# Patient Record
Sex: Female | Born: 1980 | Race: White | Hispanic: No | Marital: Married | Smoking: Former smoker
Health system: Southern US, Community
[De-identification: ages and names within clinical notes are randomized; demographics above are authoritative.]

## PROBLEM LIST (undated history)

## (undated) DIAGNOSIS — E119 Type 2 diabetes mellitus without complications: Secondary | ICD-10-CM

## (undated) DIAGNOSIS — F419 Anxiety disorder, unspecified: Secondary | ICD-10-CM

## (undated) HISTORY — PX: OTHER SURGICAL HISTORY: SHX169

## (undated) HISTORY — PX: CHOLECYSTECTOMY: SHX55

## (undated) HISTORY — PX: WISDOM TOOTH EXTRACTION: SHX21

---

## 2013-04-01 ENCOUNTER — Encounter (HOSPITAL_COMMUNITY): Payer: Self-pay | Admitting: Specialist

## 2013-04-07 ENCOUNTER — Encounter: Payer: 59 | Attending: Specialist | Admitting: *Deleted

## 2013-04-07 ENCOUNTER — Ambulatory Visit (HOSPITAL_COMMUNITY)
Admission: RE | Admit: 2013-04-07 | Discharge: 2013-04-07 | Disposition: A | Discharge status: Home / Self Care | Payer: 59 | Source: Ambulatory Visit | Attending: Specialist | Admitting: Specialist

## 2013-04-07 DIAGNOSIS — E119 Type 2 diabetes mellitus without complications: Secondary | ICD-10-CM

## 2013-04-07 DIAGNOSIS — Z713 Dietary counseling and surveillance: Secondary | ICD-10-CM | POA: Insufficient documentation

## 2013-04-07 DIAGNOSIS — O99810 Abnormal glucose complicating pregnancy: Secondary | ICD-10-CM | POA: Insufficient documentation

## 2013-04-07 NOTE — Consult Note (Signed)
Yvonne Mann come to clinic for GDM education. She was diagnosed with T2DM approximately 2 yrs ago. She was placed on Metformin without dietary education. She is now [redacted] wks pregnant. Dr. Wiliam Ke in [place] is her OB/GYN who sent her to MFM for consult and education. Her Metformin had been increased and started on Novolog 8u with each meal. She has been educations on the A&P of T2DM diabetes. Dietary modifications. 1-2 (+-)carb choices for breakfast, 2-3(+-) for lunch and 2-3(+-) for dinner. She is to have protein with each meal. She presently has a glucometer and is testing 4Xdaily and logging dietary intake. Most of her readings are outside of the reading goals of FBS 60-90 and 2hpp <120. She was accompanied by her mother. Her father is also T2DM. She will continue to follow with Dr. Wiliam Ke.

## 2013-04-07 NOTE — Consult Note (Signed)
MFM consult  32 yr old G1P0 at 8 weeks with type II diabetes referred by Dr. Wiliam Ke for consult.  Past OB hx: none Past gyn hx: infertility- this pregnancy conceived with clomid PMH: diabetes diagnosed 2 years ago was managed with metformin PSH: none Medications: Metformin 1000 mg qam, 1000mg  pre dinner; 500mg  qhs; humolog 8 units with meals; PNV Allergies: codeine Social history: stopped tobacco with pregnancy, no alcohol or illicit drugs in pregnancy  I counseled the patient as follows:  1. Type II diabetes:  Discussed increased risks in pregnancy include: fetal macrosomia, shoulder dystocia, and increased risk of requiring a Cesarean delivery. There is also an increased risk of developing preeclampsia during the pregnancy. There is an increased risk of congenital malformations related to level of diabetic control in the first trimester. I discussed there is an increased risk of stillbirth, neonatal hypoglycemia, neonatal jaundice, and neonatal electrolyte disturbances. I recommend strict glucose control maintaining fasting blood sugars <90 and 2 hour postprandial values <120. I reviewed the patient's blood sugars and all fasting values were elevated. Given this I recommend discontinuing metformin (as patient is on a large dose and still not controlled at 8 weeks) and starting a long acting insulin either NPH in the am and qhs or lantus qhs and adjust as needed to attain sugar goals. Patient has some elevated post meals but most are well controlled. Recommend continue humolog and adjust as needed to attain sugar control. I recommend starting fetal kick counts- at [redacted] weeks gestation. I recommend starting antenatal testing with either weekly biophysical profiles or twice weekly nonstress tests and weekly amniotic fluid index starting at 32 weeks.  I recommend following fetal growth every 4 weeks. I recommend delivery by estimated due date but not prior to 39 weeks in the absence of other  complications. I recommend obtain baseline TSH, CBC, AST, ALT, BUN, creatinine, uric acid, 24 hour urine total protein, and EKG. Recommend check hemoglobin A1C every trimester.  Given the increased risk of congenital anomalies recommend fetal anatomic survey at 18-20 weeks and fetal echocardiogram at 18-[redacted] weeks gestation.  2. Discussed option of first trimester screen and its limitations in detecting fetal aneuploidy. Patient is interested in this screening and will schedule. - if done recommend maternal serum AFP at 15-[redacted] weeks gestation  I spent 30 minutes in face to face consultation with the patient.  Please call with questions.  Eulis Foster, MD

## 2013-04-14 NOTE — Consult Note (Signed)
GDM EDUCATION Patient present for GDM education. Recent readings reviewed, instructed in carb counting with meal plan provided.

## 2013-05-03 ENCOUNTER — Other Ambulatory Visit: Payer: Self-pay

## 2013-06-09 ENCOUNTER — Other Ambulatory Visit (HOSPITAL_COMMUNITY): Payer: Self-pay | Admitting: Specialist

## 2013-06-09 DIAGNOSIS — IMO0002 Reserved for concepts with insufficient information to code with codable children: Secondary | ICD-10-CM

## 2013-06-09 DIAGNOSIS — O169 Unspecified maternal hypertension, unspecified trimester: Secondary | ICD-10-CM

## 2013-06-10 ENCOUNTER — Encounter (HOSPITAL_COMMUNITY): Payer: Self-pay

## 2013-06-10 ENCOUNTER — Ambulatory Visit (HOSPITAL_COMMUNITY)
Admission: RE | Admit: 2013-06-10 | Discharge: 2013-06-10 | Disposition: A | Discharge status: Home / Self Care | Payer: 59 | Source: Ambulatory Visit | Attending: Specialist | Admitting: Specialist

## 2013-06-10 DIAGNOSIS — IMO0002 Reserved for concepts with insufficient information to code with codable children: Secondary | ICD-10-CM

## 2013-06-10 DIAGNOSIS — O10019 Pre-existing essential hypertension complicating pregnancy, unspecified trimester: Secondary | ICD-10-CM | POA: Insufficient documentation

## 2013-06-10 DIAGNOSIS — E669 Obesity, unspecified: Secondary | ICD-10-CM | POA: Insufficient documentation

## 2013-06-10 DIAGNOSIS — Z1389 Encounter for screening for other disorder: Secondary | ICD-10-CM | POA: Insufficient documentation

## 2013-06-10 DIAGNOSIS — O358XX0 Maternal care for other (suspected) fetal abnormality and damage, not applicable or unspecified: Secondary | ICD-10-CM | POA: Insufficient documentation

## 2013-06-10 DIAGNOSIS — O24919 Unspecified diabetes mellitus in pregnancy, unspecified trimester: Secondary | ICD-10-CM | POA: Insufficient documentation

## 2013-06-10 DIAGNOSIS — O169 Unspecified maternal hypertension, unspecified trimester: Secondary | ICD-10-CM

## 2013-06-10 DIAGNOSIS — Z363 Encounter for antenatal screening for malformations: Secondary | ICD-10-CM | POA: Insufficient documentation

## 2013-06-30 ENCOUNTER — Other Ambulatory Visit (HOSPITAL_COMMUNITY): Payer: Self-pay | Admitting: Specialist

## 2013-06-30 DIAGNOSIS — IMO0002 Reserved for concepts with insufficient information to code with codable children: Secondary | ICD-10-CM

## 2013-07-05 ENCOUNTER — Ambulatory Visit (HOSPITAL_COMMUNITY)
Admission: RE | Admit: 2013-07-05 | Discharge: 2013-07-05 | Disposition: A | Discharge status: Home / Self Care | Payer: 59 | Source: Ambulatory Visit | Attending: Specialist | Admitting: Specialist

## 2013-07-05 VITALS — BP 141/83 | HR 100 | Wt 306.0 lb

## 2013-07-05 DIAGNOSIS — IMO0002 Reserved for concepts with insufficient information to code with codable children: Secondary | ICD-10-CM

## 2013-07-05 DIAGNOSIS — O10019 Pre-existing essential hypertension complicating pregnancy, unspecified trimester: Secondary | ICD-10-CM | POA: Insufficient documentation

## 2013-07-05 DIAGNOSIS — O24919 Unspecified diabetes mellitus in pregnancy, unspecified trimester: Secondary | ICD-10-CM | POA: Insufficient documentation

## 2013-07-05 DIAGNOSIS — E669 Obesity, unspecified: Secondary | ICD-10-CM | POA: Insufficient documentation

## 2013-07-05 NOTE — Progress Notes (Signed)
Yvonne Mann  was seen today for an ultrasound appointment.  See full report in [facility].  Impression: Single IUP at 21 3/7 weeks Type 2 diabetes, Chronic hypertension Normal interval anatomy. Somewhat limited views of the fetal heart and face (profile) obtained. Anterior placenta without previa Normal amniotic fluid volume  Fetal echo scheduled 3 December  Recommendations: Recommend follow-up ultrasound examination in 6 weeks for growth and to reevaluate fetal face / heart.  Alpha Gula, MD

## 2013-08-16 ENCOUNTER — Encounter (HOSPITAL_COMMUNITY): Payer: Self-pay

## 2013-08-16 ENCOUNTER — Ambulatory Visit (HOSPITAL_COMMUNITY)
Admission: RE | Admit: 2013-08-16 | Discharge: 2013-08-16 | Disposition: A | Discharge status: Home / Self Care | Payer: 59 | Source: Ambulatory Visit | Attending: Specialist | Admitting: Specialist

## 2013-08-16 DIAGNOSIS — O10019 Pre-existing essential hypertension complicating pregnancy, unspecified trimester: Secondary | ICD-10-CM | POA: Insufficient documentation

## 2013-08-16 DIAGNOSIS — O24919 Unspecified diabetes mellitus in pregnancy, unspecified trimester: Secondary | ICD-10-CM | POA: Insufficient documentation

## 2013-08-16 DIAGNOSIS — E669 Obesity, unspecified: Secondary | ICD-10-CM | POA: Insufficient documentation

## 2013-08-16 DIAGNOSIS — IMO0002 Reserved for concepts with insufficient information to code with codable children: Secondary | ICD-10-CM

## 2014-01-26 ENCOUNTER — Ambulatory Visit: Payer: Self-pay | Admitting: Surgery

## 2014-01-27 LAB — PATHOLOGY REPORT

## 2014-04-15 ENCOUNTER — Encounter (HOSPITAL_COMMUNITY): Payer: Self-pay | Admitting: *Deleted

## 2014-06-19 ENCOUNTER — Encounter (HOSPITAL_COMMUNITY): Payer: Self-pay | Admitting: *Deleted

## 2014-12-09 NOTE — Op Note (Signed)
PATIENT NAME:  Yvonne Mann, [name] MR#:  [id number] DATE OF BIRTH:  Sep 13, 1980  DATE OF PROCEDURE:  01/26/2014  DATE OF DICTATION: 01/26/2014   PREOPERATIVE DIAGNOSIS: Biliary colic and cholelithiasis.   POSTOPERATIVE DIAGNOSIS: Biliary colic and cholelithiasis.   PROCEDURE PERFORMED: Robotically assisted multiport laparoscopic cholecystectomy.   SURGEON: Hashir Deleeuw A. Egbert GaribaldiBird, MD  ASSISTANT: None.   ANESTHESIA: General endotracheal.   SPECIMENS: Gallbladder with contents.   ESTIMATED BLOOD LOSS: 25 mL.   DRAINS: None.   LAP AND NEEDLE COUNT: Correct x2.   DESCRIPTION OF PROCEDURE: With informed consent, supine position, general endotracheal anesthesia, the patient's abdomen was widely prepped and draped with ChloraPrep solution.   Timeout was observed.   An infraumbilical transverse oriented skin incision was fashioned with a scalpel and carried down to the fascia with sharp dissection. The fascia was incised in the midline and elevated with Kocher clamps. The peritoneum was entered sharply. A 10 mm balloon Hasson trocar was then placed, and pneumoperitoneum was established. The remaining trocars were placed in the standard multiport orientation. The patient was then positioned in reverse Trendelenburg and airplane right side up. The patient cart was docked. Robotic arms were placed. Instruments were directed to the target anatomy under direct visualization. I then moved to the console.   Traction was placed on the body of the gallbladder, elevating it towards the right shoulder and laterally. The hepatoduodenal ligament was then incised with blunt technique, liberating a cystic artery and cystic duct and small lymphatic. The cystic duct was critically identified along with the artery. The cystic duct was doubly clipped on the portal side, singly clipped on the gallbladder side with medium locking Hemoclip. Lymphatic was divided between single hemoclips. The cystic artery was divided between 2  clips on the portal side, singly clipped on the gallbladder side and sharp scissors. The gallbladder was then retrieved off the gallbladder fossa utilizing hook cautery apparatus, and it was placed into an Endo Catch device and retrieved. Pneumoperitoneum was then re-established. The patient cart was then undocked. The right upper quadrant was irrigated with approximately 500 mL of warm normal saline and aspirated dry, and hemostasis appeared to be adequate. Ports were then removed, and the infraumbilical fascial defect was closed with a vertically oriented #0 Vicryl suture in figure-of-eight orientation. A total of 30 mL of 0.25% plain Marcaine was infiltrated along all skin incisions and fascial incisions prior to closure. The 4-0 Vicryl subcuticular was applied to all skin edges, followed by the application of benzoin, Steri-Strips, Telfa and Tegaderm. The patient was then subsequently extubated and taken to the recovery room in stable and satisfactory condition by anesthesia services.    ____________________________ Redge GainerMark A. Egbert GaribaldiBird, MD mab:lb D: 01/26/2014 10:50:16 ET T: 01/26/2014 11:11:40 ET JOB#: [id number]  cc: Loraine LericheMark A. Egbert GaribaldiBird, MD, <Dictator> Raynald KempMARK A Desirre Eickhoff MD ELECTRONICALLY SIGNED 01/29/2014 11:47

## 2017-03-12 ENCOUNTER — Other Ambulatory Visit (HOSPITAL_COMMUNITY): Payer: Self-pay | Admitting: Specialist

## 2017-03-12 DIAGNOSIS — Z3689 Encounter for other specified antenatal screening: Secondary | ICD-10-CM

## 2017-04-15 ENCOUNTER — Encounter (HOSPITAL_COMMUNITY): Payer: Self-pay | Admitting: *Deleted

## 2017-04-16 ENCOUNTER — Encounter: Payer: 59 | Attending: Obstetrics and Gynecology | Admitting: *Deleted

## 2017-04-16 ENCOUNTER — Ambulatory Visit (HOSPITAL_COMMUNITY)
Admission: RE | Admit: 2017-04-16 | Discharge: 2017-04-16 | Disposition: A | Discharge status: Home / Self Care | Payer: 59 | Source: Ambulatory Visit | Attending: Specialist | Admitting: Specialist

## 2017-04-16 ENCOUNTER — Ambulatory Visit (HOSPITAL_COMMUNITY): Payer: Self-pay

## 2017-04-16 ENCOUNTER — Encounter (HOSPITAL_COMMUNITY): Payer: Self-pay

## 2017-04-16 DIAGNOSIS — O24112 Pre-existing diabetes mellitus, type 2, in pregnancy, second trimester: Secondary | ICD-10-CM | POA: Diagnosis not present

## 2017-04-16 DIAGNOSIS — O24911 Unspecified diabetes mellitus in pregnancy, first trimester: Secondary | ICD-10-CM | POA: Diagnosis not present

## 2017-04-16 DIAGNOSIS — Z3689 Encounter for other specified antenatal screening: Secondary | ICD-10-CM

## 2017-04-16 DIAGNOSIS — Z3A16 16 weeks gestation of pregnancy: Secondary | ICD-10-CM | POA: Diagnosis not present

## 2017-04-16 DIAGNOSIS — E119 Type 2 diabetes mellitus without complications: Secondary | ICD-10-CM | POA: Diagnosis not present

## 2017-04-16 DIAGNOSIS — E1165 Type 2 diabetes mellitus with hyperglycemia: Secondary | ICD-10-CM

## 2017-04-16 DIAGNOSIS — O24312 Unspecified pre-existing diabetes mellitus in pregnancy, second trimester: Secondary | ICD-10-CM | POA: Diagnosis present

## 2017-04-16 HISTORY — DX: Anxiety disorder, unspecified: F41.9

## 2017-04-16 HISTORY — DX: Type 2 diabetes mellitus without complications: E11.9

## 2017-04-16 NOTE — Progress Notes (Signed)
Maternal Fetal Medicine Consultation  Requesting Provider(s):Newell  Primary OB: Newell Reason for consultation: Type 2 diabetes  HPI:36 yo P1001 at 16+6 weeks with type 2 diabetes diagnosed c. 5 years ago. She is under the care of [place] is managing her diabetes. She is currently on Metformin 850mg  TID, Humalog-N 16 units at bedtime, and Humalog 8 to 10 units with each meal. She has had elevated fastings between 120 and 140 and most of her postprandials are elevated as well. Intrestingly, she had a HgbA1C of 6.7% in May, and last week a repeat was 5.6%. She was seen here in MFM for an initial consult and for US with her last baby in 2015. That pregnancy ended with a cesarean delivery of a healthy 7 pound 13 ounce infant. OB History: OB History    Gravida Para Term Preterm AB Living   3 1 1     1    SAB TAB Ectopic Multiple Live Births                  PMH:  Past Medical History:  Diagnosis Date  . Anxiety   . Diabetes mellitus without complication (HCC)     PSH:  Past Surgical History:  Procedure Laterality Date  . CESAREAN SECTION    . CHOLECYSTECTOMY    . Pylenoidal cyst    . WISDOM TOOTH EXTRACTION     Meds: Metformin/insulin as described above, PNV, Lexapro 10mg  qD Allergies: oxycodone/codiene FH: See EPIC section Soc: See EPIC section  Review of Systems: no vaginal bleeding or cramping/contractions, no LOF, no nausea/vomiting. All other systems reviewed and are negative.  PE:  VS: See EPIC section GEN: well-appearing female ABD: gravid, NT  Please see separate document for fetal ultrasound report.  A/P: Type 2 diabetes The patient remembers a good bit of the counseling given during her last MFM consult in 2015. I reiterated the goals for euglycemia (FBS <100 at least, preferably <95, 2 hour postprandials <120) I would recommend discontinuing her metformin as she is on a very high dose, and if her insurance will cover it, making some changes in  her insulin. I would recommend using an ultra-long insulin at bedtime. My choice would be Lantus 20 units SQ. The humalog can remain the same but would increase the dosage to 15-20 units before meals. Weekly evaluations in your office of by phone will be necessary during the first few weeks to adjust the doses to get as close as possible to euglycemia. I have asked her to begin 81mg  of aspirin daily for [facility] prevention You have already started her baseline lab collection and I agree with your plan to repeat the 24 hour urine after her UTI is treated. I will set her up for a fetal echocardiogram here, and will see her again in MFM in4 weeks. I anticipate seeing her every 4 weeks for scans. She should start antepartum testing at 30-32 weeks and continue through delivery   Thank you for the opportunity to be a part of the care of Yvonne PuntAmanda S Mann. Please contact our office if we can be of further assistance.   I spent approximately 30 minutes with this patient with over 50% of time spent in face-to-face counseling.

## 2017-04-16 NOTE — Progress Notes (Signed)
  Patient was seen on 04/16/2017 for Type 2 Diabetes with pregnancy self-management. She is here with her husband. She verbalized good understanding of Carbohydrate Counting and states she is comfortable with insuilin injections.  She is testing 3-4 times daily and recording food and BG info in small notebook which she brought to the appointment. The following learning objectives were met by the patient :   States the definition of Type 2 Diabetes with pregnancy  States why dietary management is important in controlling blood glucose  Describes the effects of carbohydrates on blood glucose levels  Demonstrates ability to create a balanced meal plan  Demonstrates carbohydrate counting   States when to check blood glucose levels  Demonstrates proper blood glucose monitoring techniques  States the effect of stress and exercise on blood glucose levels  States the importance of limiting caffeine and abstaining from alcohol and smoking  I discussed with her today the potential option of moving to an Insulin to Carb ratio with her humulog insulin at meal time if her MD agreed, so the dose could be adjusted more accurately for any variation in carb intake. I used example that if she is taking 8 units for a 32 gram carb meal that = an Insulin to Carb Ratio of 1 unit/4 grams carbohydrate. This can be better assessed after she completes Log Sheet with carb information to see what ratio will be most effective for her.  I also provided a Log Sheet for her to record her BG and carb intake at each meal so her insulin doses can be better assessed. She agreed to use the Log Sheet I provided and to bring it to every medical appointment.   Plan:  Aim for 2-3 Carb Choices per meal (30-45 grams)  Aim for 1-2 Carbs per snack Continue  reading food labels for Total Carbohydrate of foods Consider  increasing your activity level by walking or other activity daily as tolerated Continue checking BG before  breakfast and 2 hours after first bite of breakfast, lunch and dinner as directed by MD  Bring Log Book to every medical appointment   Take medication as directed by MD  Patient already has a meter and is testing pre breakfast and 2 hours each meal as directed by MD Review of Log Book shows: FBG between 120-140 and post meal BG often above 120 mg/dl.  Patient instructed to monitor glucose levels: FBS: 60 - 95 mg/dl 2 hour: <120 mg/dl  Patient received the following handouts:  Nutrition Diabetes and Pregnancy  Carbohydrate Counting List  Insulin Action handout  Patient will be seen for follow-up in 4 weeks.

## 2017-04-17 ENCOUNTER — Other Ambulatory Visit (HOSPITAL_COMMUNITY): Payer: Self-pay | Admitting: *Deleted

## 2017-04-17 DIAGNOSIS — O24119 Pre-existing diabetes mellitus, type 2, in pregnancy, unspecified trimester: Secondary | ICD-10-CM

## 2017-04-17 DIAGNOSIS — Z794 Long term (current) use of insulin: Principal | ICD-10-CM

## 2017-04-30 ENCOUNTER — Encounter (HOSPITAL_COMMUNITY): Payer: Self-pay

## 2017-05-12 ENCOUNTER — Encounter (HOSPITAL_COMMUNITY): Payer: Self-pay

## 2017-05-14 ENCOUNTER — Encounter (HOSPITAL_COMMUNITY): Payer: Self-pay

## 2017-05-14 ENCOUNTER — Other Ambulatory Visit (HOSPITAL_COMMUNITY): Payer: Self-pay | Admitting: *Deleted

## 2017-05-14 ENCOUNTER — Ambulatory Visit (HOSPITAL_COMMUNITY)
Admission: RE | Admit: 2017-05-14 | Discharge: 2017-05-14 | Disposition: A | Discharge status: Home / Self Care | Payer: 59 | Source: Ambulatory Visit | Attending: Specialist | Admitting: Specialist

## 2017-05-14 ENCOUNTER — Ambulatory Visit (HOSPITAL_COMMUNITY): Payer: 59

## 2017-05-14 DIAGNOSIS — Z3A20 20 weeks gestation of pregnancy: Secondary | ICD-10-CM | POA: Diagnosis not present

## 2017-05-14 DIAGNOSIS — Z362 Encounter for other antenatal screening follow-up: Secondary | ICD-10-CM | POA: Diagnosis not present

## 2017-05-14 DIAGNOSIS — Z794 Long term (current) use of insulin: Secondary | ICD-10-CM

## 2017-05-14 DIAGNOSIS — O24312 Unspecified pre-existing diabetes mellitus in pregnancy, second trimester: Secondary | ICD-10-CM | POA: Diagnosis not present

## 2017-05-14 DIAGNOSIS — O99212 Obesity complicating pregnancy, second trimester: Secondary | ICD-10-CM | POA: Diagnosis not present

## 2017-05-14 DIAGNOSIS — O24119 Pre-existing diabetes mellitus, type 2, in pregnancy, unspecified trimester: Secondary | ICD-10-CM

## 2017-05-15 ENCOUNTER — Encounter (HOSPITAL_COMMUNITY): Payer: Self-pay

## 2017-06-11 ENCOUNTER — Ambulatory Visit (HOSPITAL_COMMUNITY)
Admission: RE | Admit: 2017-06-11 | Discharge: 2017-06-11 | Disposition: A | Discharge status: Home / Self Care | Payer: 59 | Source: Ambulatory Visit | Attending: Specialist | Admitting: Specialist

## 2017-06-11 ENCOUNTER — Other Ambulatory Visit (HOSPITAL_COMMUNITY): Payer: Self-pay | Admitting: Obstetrics and Gynecology

## 2017-06-11 ENCOUNTER — Other Ambulatory Visit (HOSPITAL_COMMUNITY): Payer: Self-pay | Admitting: *Deleted

## 2017-06-11 ENCOUNTER — Encounter (HOSPITAL_COMMUNITY): Payer: Self-pay

## 2017-06-11 DIAGNOSIS — O24312 Unspecified pre-existing diabetes mellitus in pregnancy, second trimester: Secondary | ICD-10-CM | POA: Diagnosis not present

## 2017-06-11 DIAGNOSIS — O24119 Pre-existing diabetes mellitus, type 2, in pregnancy, unspecified trimester: Secondary | ICD-10-CM

## 2017-06-11 DIAGNOSIS — Z362 Encounter for other antenatal screening follow-up: Secondary | ICD-10-CM | POA: Insufficient documentation

## 2017-06-11 DIAGNOSIS — O99212 Obesity complicating pregnancy, second trimester: Secondary | ICD-10-CM | POA: Insufficient documentation

## 2017-06-11 DIAGNOSIS — Z3A24 24 weeks gestation of pregnancy: Secondary | ICD-10-CM | POA: Insufficient documentation

## 2017-06-11 DIAGNOSIS — IMO0002 Reserved for concepts with insufficient information to code with codable children: Secondary | ICD-10-CM

## 2017-06-11 DIAGNOSIS — Z0489 Encounter for examination and observation for other specified reasons: Secondary | ICD-10-CM

## 2017-07-10 ENCOUNTER — Other Ambulatory Visit (HOSPITAL_COMMUNITY): Payer: Self-pay | Admitting: Maternal & Fetal Medicine

## 2017-07-10 ENCOUNTER — Encounter (HOSPITAL_COMMUNITY): Payer: Self-pay

## 2017-07-10 ENCOUNTER — Other Ambulatory Visit (HOSPITAL_COMMUNITY): Payer: Self-pay | Admitting: *Deleted

## 2017-07-10 ENCOUNTER — Ambulatory Visit (HOSPITAL_COMMUNITY)
Admission: RE | Admit: 2017-07-10 | Discharge: 2017-07-10 | Disposition: A | Discharge status: Home / Self Care | Payer: 59 | Source: Ambulatory Visit | Attending: Specialist | Admitting: Specialist

## 2017-07-10 DIAGNOSIS — O24313 Unspecified pre-existing diabetes mellitus in pregnancy, third trimester: Secondary | ICD-10-CM

## 2017-07-10 DIAGNOSIS — Z6841 Body Mass Index (BMI) 40.0 and over, adult: Secondary | ICD-10-CM | POA: Insufficient documentation

## 2017-07-10 DIAGNOSIS — E119 Type 2 diabetes mellitus without complications: Secondary | ICD-10-CM | POA: Insufficient documentation

## 2017-07-10 DIAGNOSIS — Z0489 Encounter for examination and observation for other specified reasons: Secondary | ICD-10-CM

## 2017-07-10 DIAGNOSIS — Z362 Encounter for other antenatal screening follow-up: Secondary | ICD-10-CM | POA: Insufficient documentation

## 2017-07-10 DIAGNOSIS — O99213 Obesity complicating pregnancy, third trimester: Secondary | ICD-10-CM

## 2017-07-10 DIAGNOSIS — O24113 Pre-existing diabetes mellitus, type 2, in pregnancy, third trimester: Secondary | ICD-10-CM

## 2017-07-10 DIAGNOSIS — Z3A29 29 weeks gestation of pregnancy: Secondary | ICD-10-CM

## 2017-07-10 DIAGNOSIS — E669 Obesity, unspecified: Secondary | ICD-10-CM | POA: Diagnosis not present

## 2017-07-10 DIAGNOSIS — Z3689 Encounter for other specified antenatal screening: Secondary | ICD-10-CM

## 2017-07-10 DIAGNOSIS — IMO0002 Reserved for concepts with insufficient information to code with codable children: Secondary | ICD-10-CM

## 2017-07-10 DIAGNOSIS — O34219 Maternal care for unspecified type scar from previous cesarean delivery: Secondary | ICD-10-CM

## 2017-07-10 NOTE — Addendum Note (Signed)
Encounter addended by: Melynda KellerVics, Analina Filla R, RDMS on: 07/10/2017 2:16 PM  Actions taken: Imaging Exam ended

## 2017-08-06 ENCOUNTER — Other Ambulatory Visit (HOSPITAL_COMMUNITY): Payer: Self-pay | Admitting: Obstetrics and Gynecology

## 2017-08-06 ENCOUNTER — Encounter (HOSPITAL_COMMUNITY): Payer: Self-pay

## 2017-08-06 ENCOUNTER — Ambulatory Visit (HOSPITAL_COMMUNITY)
Admission: RE | Admit: 2017-08-06 | Discharge: 2017-08-06 | Disposition: A | Discharge status: Home / Self Care | Payer: 59 | Source: Ambulatory Visit | Attending: Specialist | Admitting: Specialist

## 2017-08-06 DIAGNOSIS — Z3A32 32 weeks gestation of pregnancy: Secondary | ICD-10-CM

## 2017-08-06 DIAGNOSIS — O34219 Maternal care for unspecified type scar from previous cesarean delivery: Secondary | ICD-10-CM | POA: Diagnosis not present

## 2017-08-06 DIAGNOSIS — Z98891 History of uterine scar from previous surgery: Secondary | ICD-10-CM

## 2017-08-06 DIAGNOSIS — O99213 Obesity complicating pregnancy, third trimester: Secondary | ICD-10-CM | POA: Diagnosis not present

## 2017-08-06 DIAGNOSIS — Z3689 Encounter for other specified antenatal screening: Secondary | ICD-10-CM | POA: Diagnosis present

## 2017-08-06 DIAGNOSIS — Z362 Encounter for other antenatal screening follow-up: Secondary | ICD-10-CM | POA: Diagnosis not present

## 2017-08-06 DIAGNOSIS — O24113 Pre-existing diabetes mellitus, type 2, in pregnancy, third trimester: Secondary | ICD-10-CM

## 2017-12-18 ENCOUNTER — Encounter (HOSPITAL_COMMUNITY): Payer: Self-pay

## 2019-06-22 IMAGING — US US MFM OB FOLLOW-UP
1 series · 12 of 28 positions shown · non-contrast
Comparison: none

[REDACTED].

1  OBENFO SUURI              [id number]       [id number]     [id number]
Indications
20 weeks gestation of pregnancy
Diabetes - Pregestational, 2nd trimester
Encounter for other antenatal screening
follow-up
Obesity complicating pregnancy, second
trimester
OB History
Blood Type:            Height:  5'6"   Weight (lb):  305      BMI:
Gravidity:    2         Term:   1        Prem:   0        SAB:   0
TOP:          0       Ectopic:  0        Living: 1
Fetal Evaluation
Num Of Fetuses:     1
Fetal Heart         159
Rate(bpm):
Cardiac Activity:   Observed
Presentation:       Breech
Placenta:           Posterior, above cervical os
P. Cord Insertion:  Visualized
Amniotic Fluid
AFI FV:      Subjectively within normal limits
Largest Pocket(cm)
4.48
Biometry
BPD:      46.9  mm     G. Age:  20w 1d         22  %    CI:        70.08   %   70 - 86
FL/HC:      18.7   %   15.9 -
HC:      178.7  mm     G. Age:  20w 2d         20  %    HC/AC:      1.20       1.06 -
AC:      148.6  mm     G. Age:  20w 1d         22  %    FL/BPD:     71.2   %
FL:       33.4  mm     G. Age:  20w 3d         28  %    FL/AC:      22.5   %   20 - 24
HUM:      33.1  mm     G. Age:  21w 1d         56  %
CER:      21.2  mm     G. Age:  20w 1d         35  %
CM:        6.2  mm
Est. FW:     344  gm    0 lb 12 oz      33  %
Gestational Age
LMP:           21w 6d       Date:   12/12/16                 EDD:   09/18/17
U/S Today:     20w 2d                                        EDD:   09/29/17
Best:          20w 6d    Det. By:   Early Ultrasound         EDD:   09/25/17
(02/03/17)
Anatomy
Cranium:               Appears normal         Aortic Arch:            Appears normal
Cavum:                 Previously seen        Ductal Arch:            Not well visualized
Ventricles:            Previously seen        Diaphragm:              Appears normal
Choroid Plexus:        Appears normal         Stomach:                Appears normal, left
sided
Cerebellum:            Appears normal         Abdomen:                Appears normal
Posterior Fossa:       Appears normal         Abdominal Wall:         Appears nml (cord
insert, abd wall)
Nuchal Fold:           Not applicable (>20    Cord Vessels:           Appears normal (3
wks GA)                                        vessel cord)
Face:                  Profile appears        Kidneys:                Appear normal
normal; orbits nws
Lips:                  Not well visualized    Bladder:                Appears normal
Thoracic:              Appears normal         Spine:                  Not well visualized
Heart:                 Not well visualized    Upper Extremities:      Appears normal;
hands nwv
RVOT:                  Not well visualized    Lower Extremities:      Appears normal;
feet nwv
LVOT:                  Not well visualized
Other:  Fetus appears to be a female previously vis.  Technically difficult due
to maternal habitus and fetal position.
Cervix Uterus Adnexa
Cervix
Length:           3.46  cm.
Normal appearance by transabdominal scan.
Impression
INDICATION: 36 yr old URHG33G at 85w6d with type II diabetes
and morbid obesity for fetal growth and reevaluate fetal
anatomy.

[Series 1: us mfm ob follow-up · 12 of 70 slices shown]
[im 3/70]
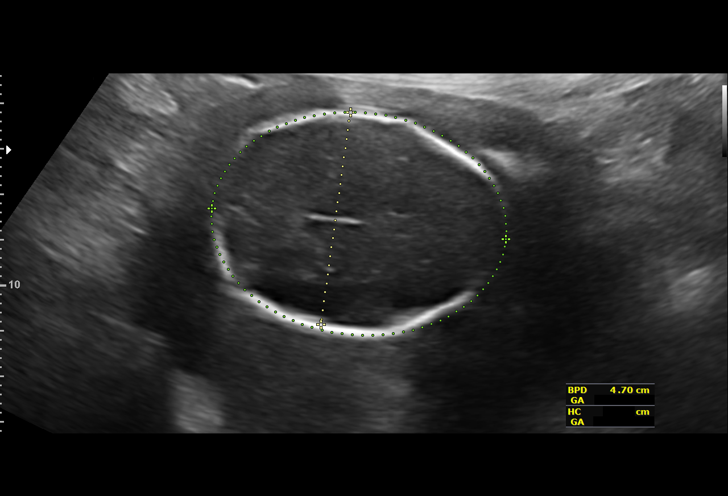
[im 8/70]
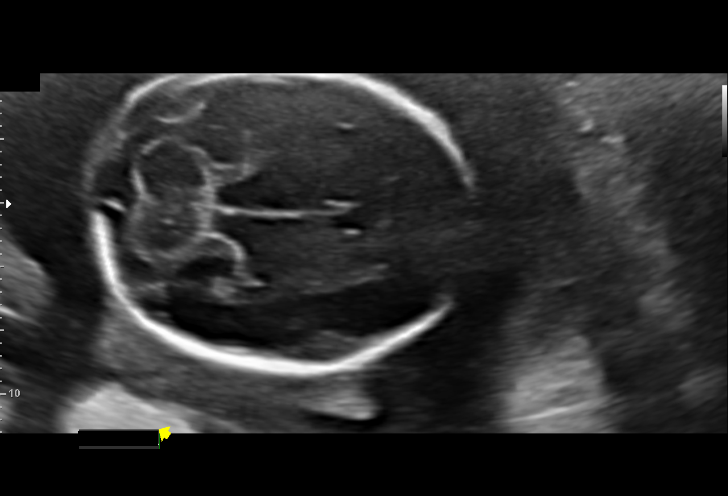
[im 13/70]
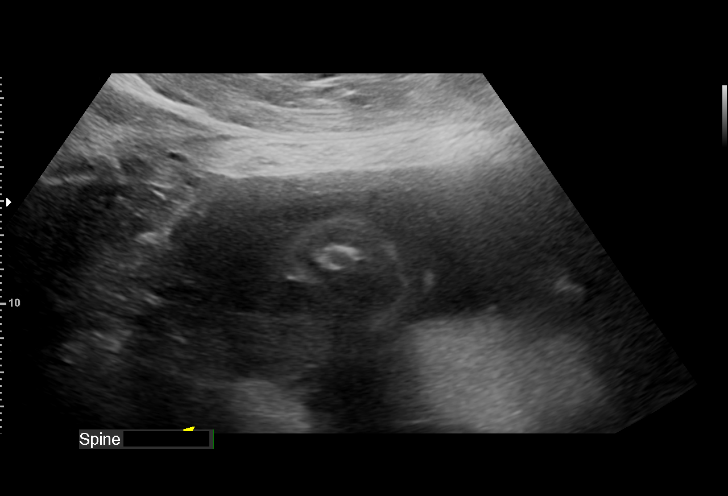
[im 21/70]
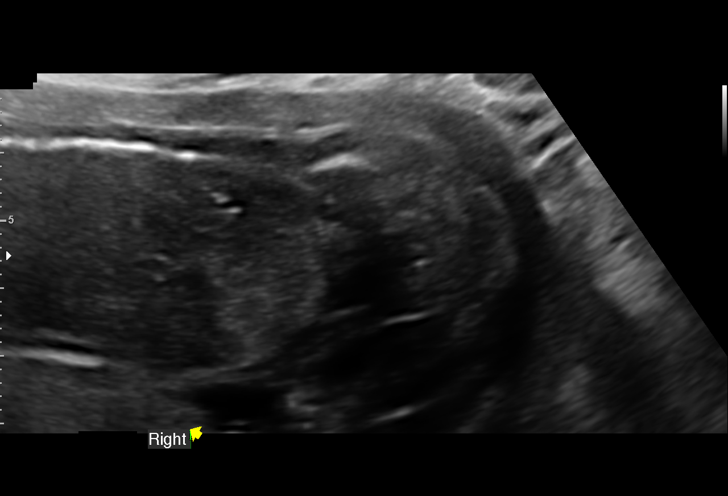
[im 26/70]
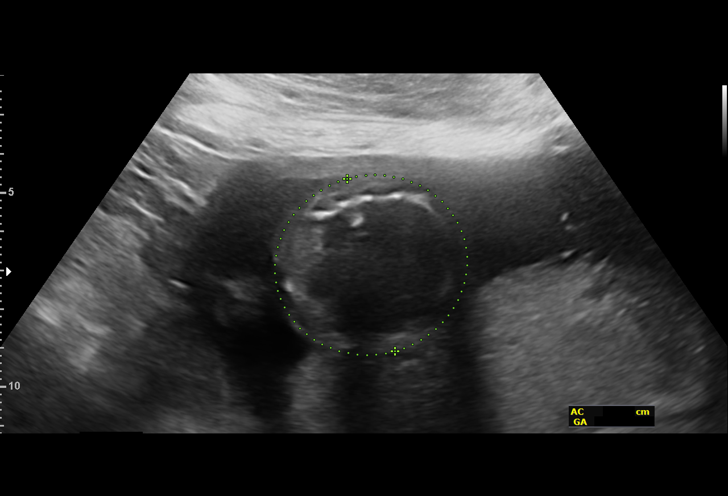
[im 31/70]
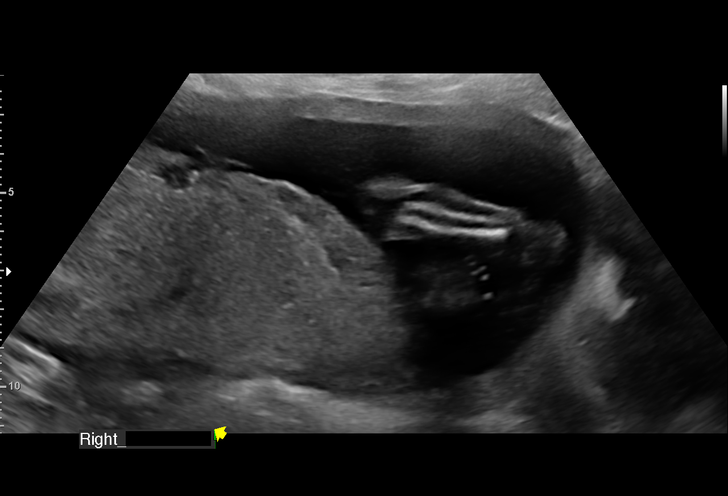
[im 39/70]
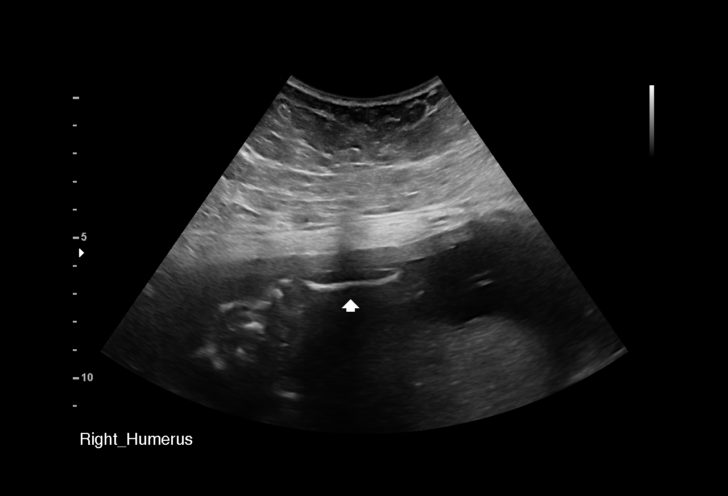
[im 44/70]
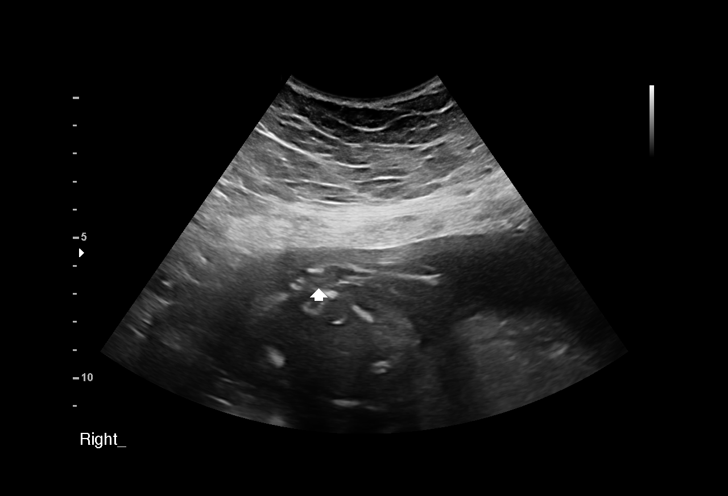
[im 49/70]
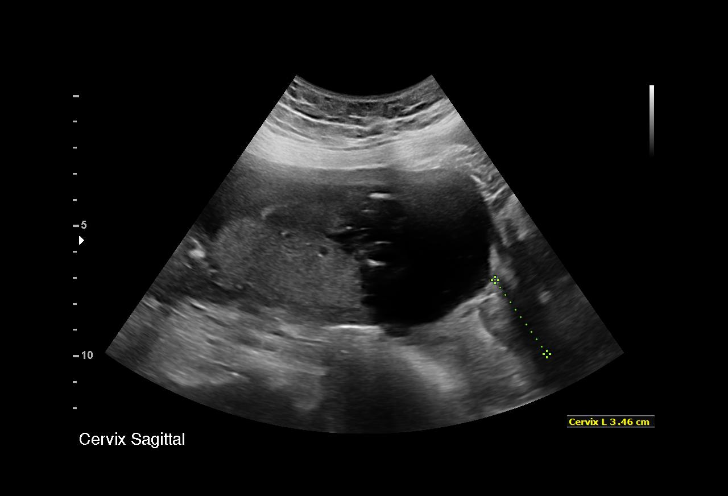
[im 57/70]
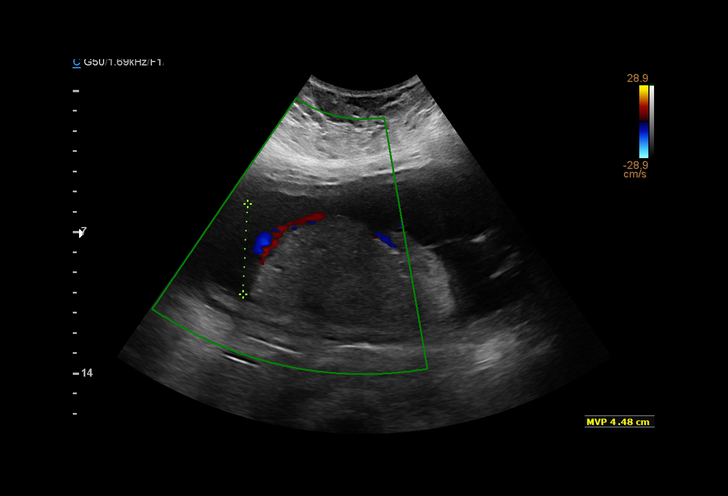
[im 62/70]
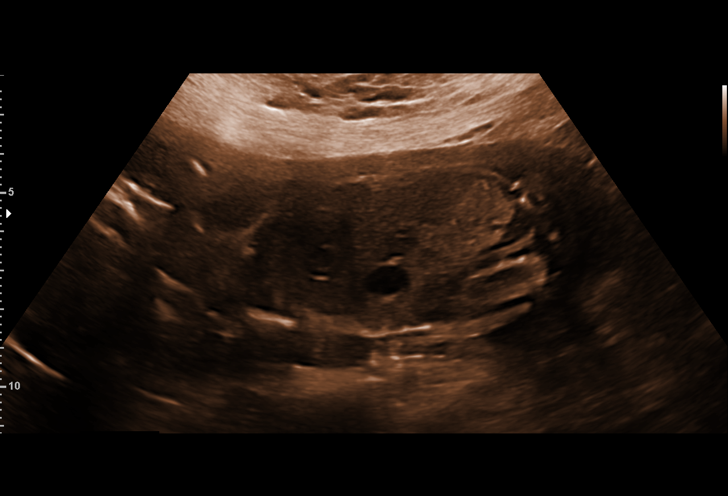
[im 67/70]
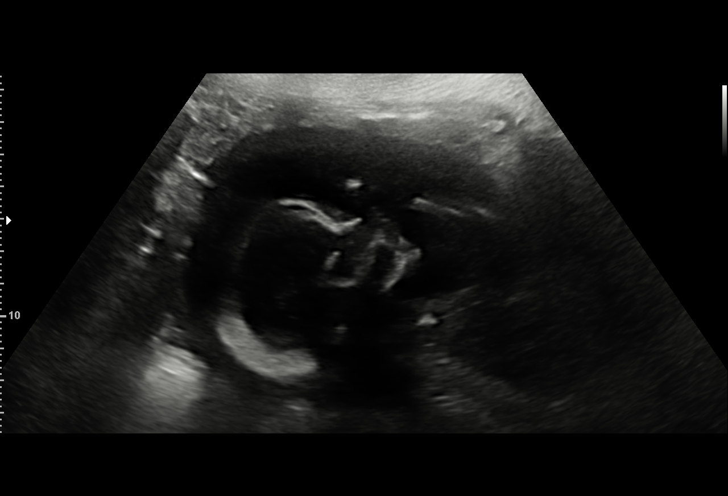

[12 of 28 positions shown; findings below may reference images not displayed]

FINDINGS: 1. Single intrauterine pregnancy.
2. Fetal biometry is consistent with dating.
3. Posterior placenta without evidence of previa.
4. Normal amniotic fluid volume.
5. Normal transabdominal cervical length.
6. The anatomy survey remains limited as above; no
abnormalities seen on limited exam.
Recommendations

1. Appropriate fetal growth.
2. Limited anatomy survey:
- recommend follow up in 4 weeks to reevaluate fetal anatomy
3. Diabetes:
- previously counseled
- recommend fetal growth every 4 weeks
- recommend start antenatal testing at 32 weeks
- had fetal echocardiogram today- results pending
- see recommendations below
4. Morbid obesity:
- previously counseled
- recommend fetal surveillance as above

Please see prior consult note for insulin recommendations
patient reports her fasting blood sugars are high and has not
made the previously recommended adjustments.
Previous recommendations from Dr. Danii were to
discontinue metformin and start on long acting insulin at
bedtime either lantus or levemir and continue humalog with
meals.

Patient is currently taking metform 850mg tid- I discussed I
would recommend decreasing to bid- take in the morning and
with dinner; or can discontinue metformin if she prefers
Also patient is on humalin-N only at bedtime- I discussed two
options given humalin N is an approximately 12 hour insulin I
would recommend adding a morning dose or discontinue the
humalin and start a long acting (lantus or levemir) at bedtime

I reviewed the patient's sugars and would recommend the
following

1. Change metformin to 850mg in the morning and with dinner
2. Increase humalog to 14 units with meals- can adjust based
on carb intake
3. 2 options
- start 20 units of humalin N in the morning and increase
night time humalin N to 26 units
or
- start lantus or levemir 40 units at bedtime
4. Recommend check 3am blood sugars and if they are low
(<60) need to decrease nighttime insulin
5. can discontinue metformin if desired
6. recommend review blood sugars at least weekly and adjust
medications as needed
7. Patient should call with any blood sugar <60 or >200

## 2019-09-14 IMAGING — US US MFM OB FOLLOW-UP
1 series · 14 of 28 positions shown · non-contrast
Comparison: none

[Series 1: us mfm ob follow-up · 14 of 36 slices shown]
[im 2/36]
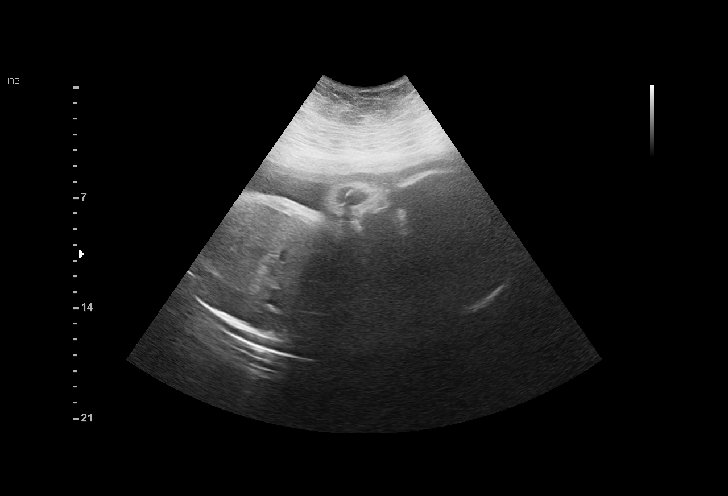
[im 4/36]
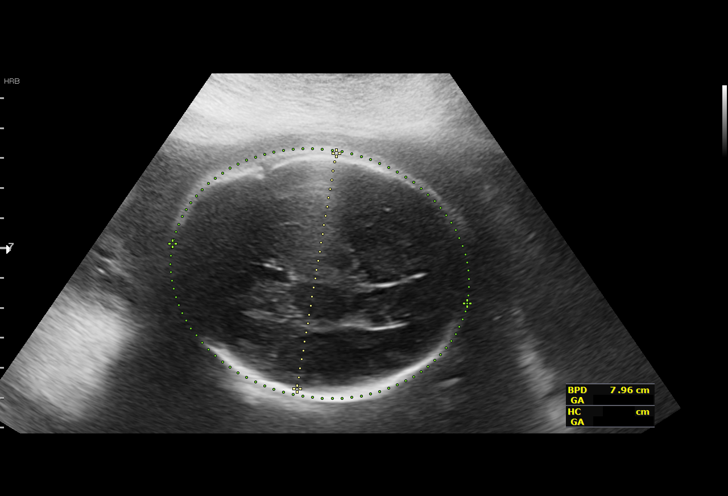
[im 7/36]
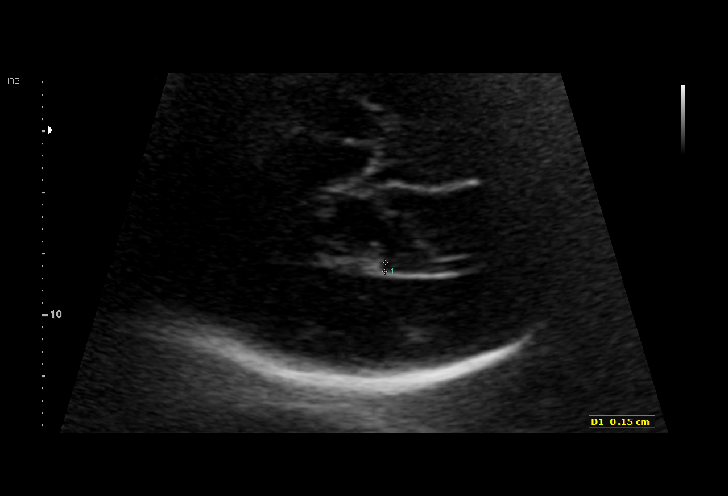
[im 10/36]
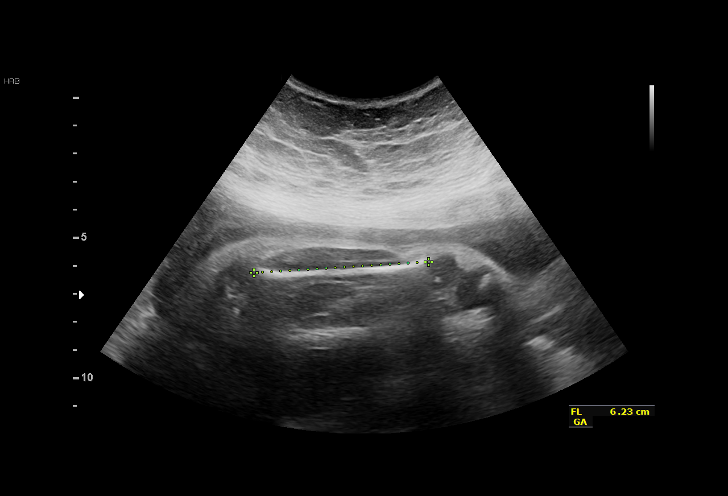
[im 12/36]
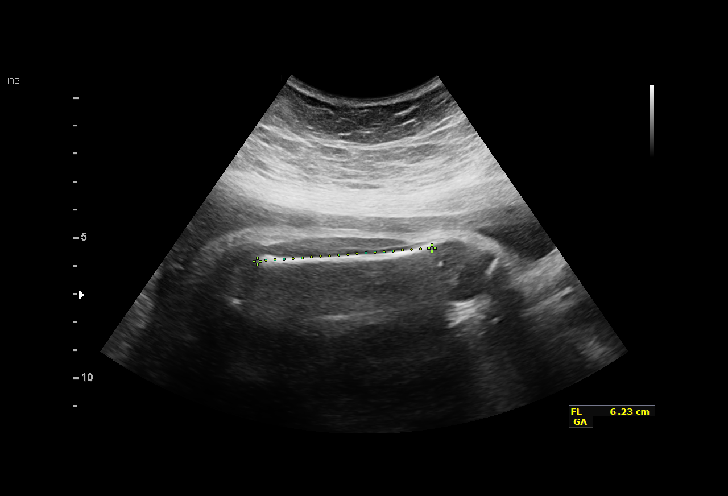
[im 15/36]
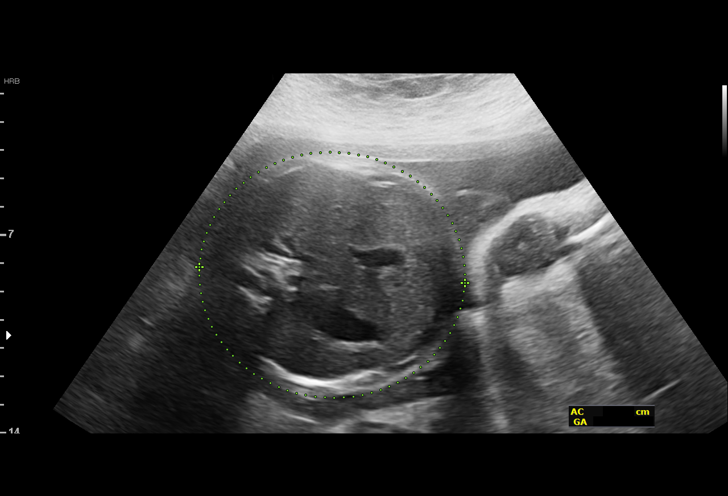
[im 17/36]
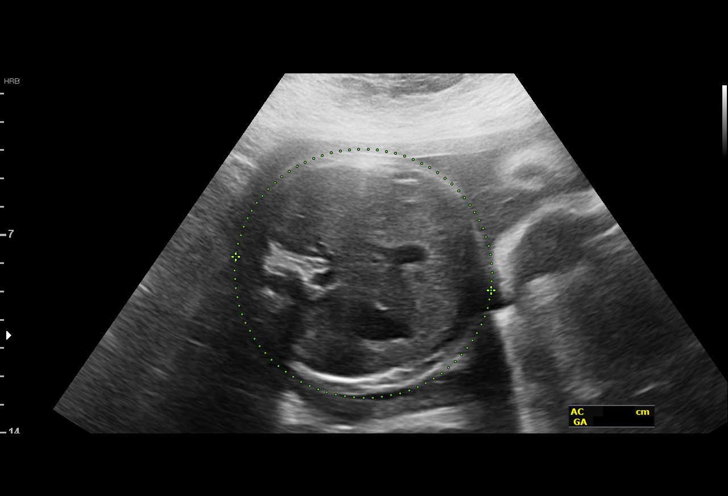
[im 20/36]
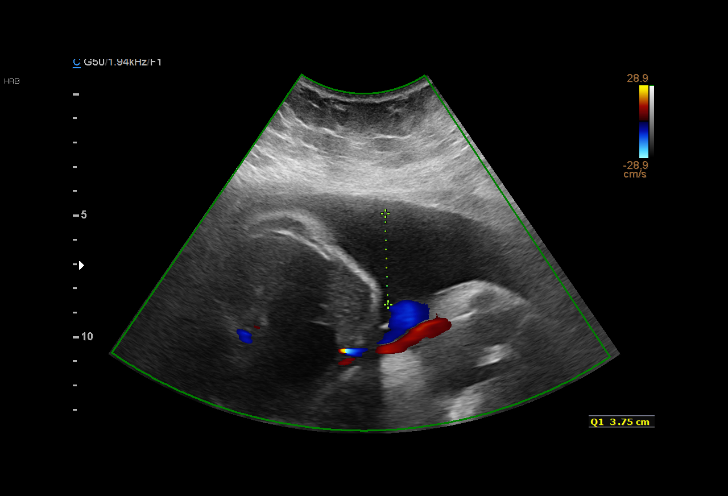
[im 23/36]
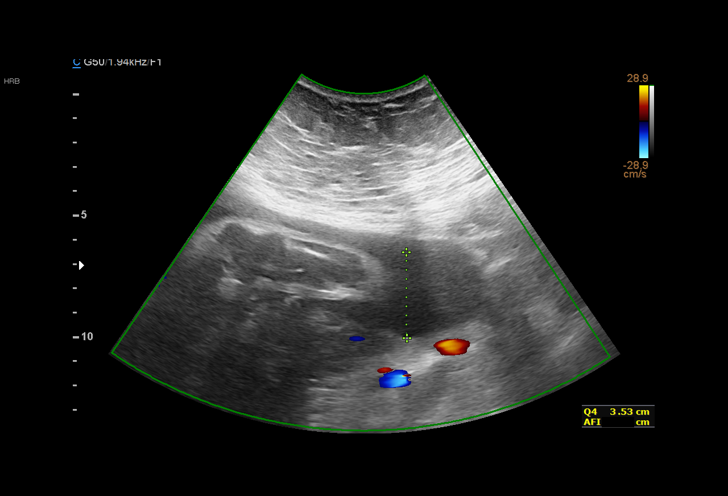
[im 25/36]
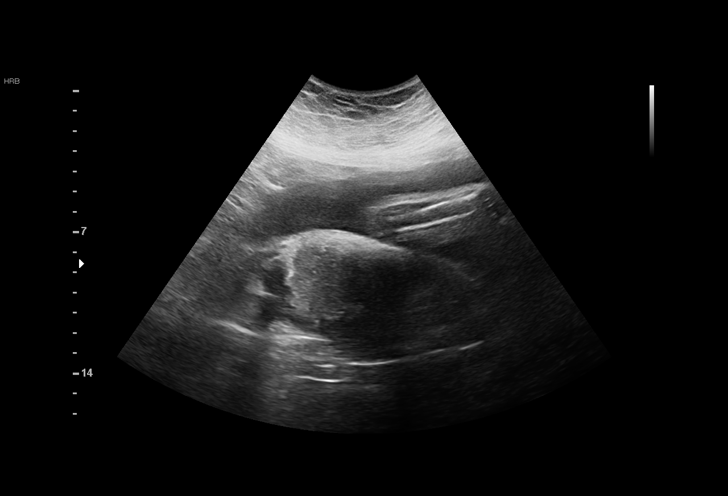
[im 28/36]
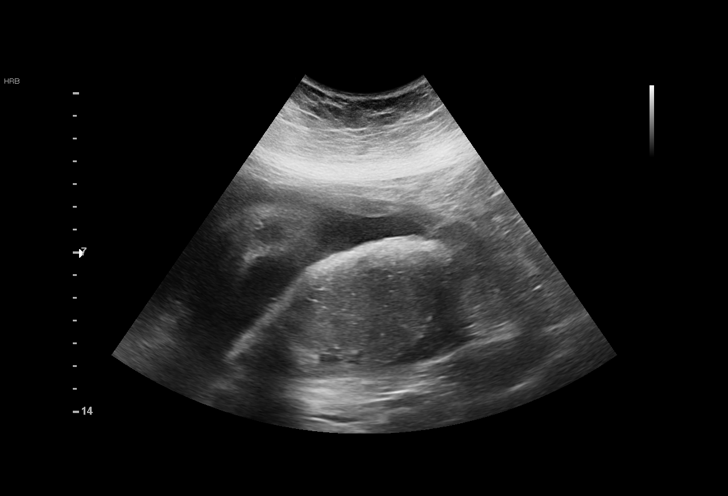
[im 30/36]
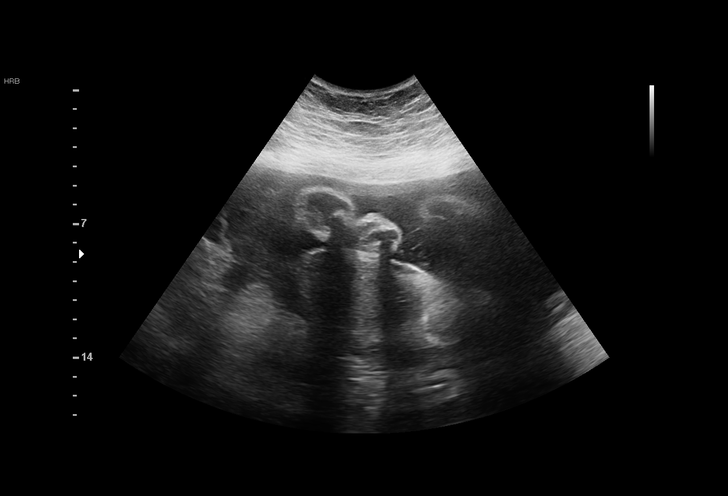
[im 33/36]
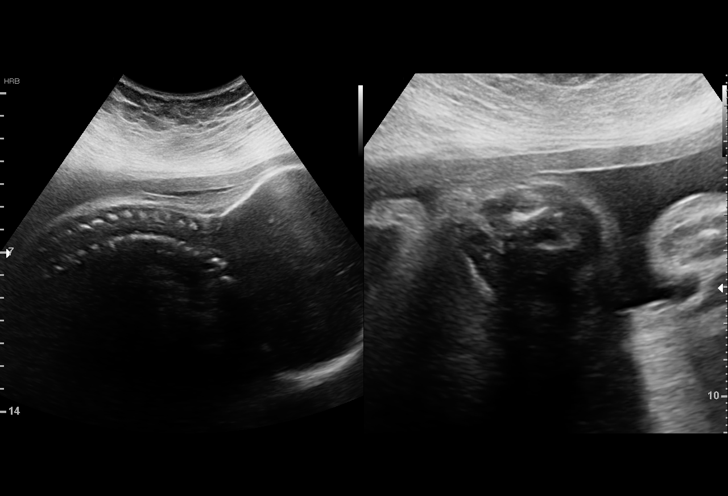
[im 36/36]
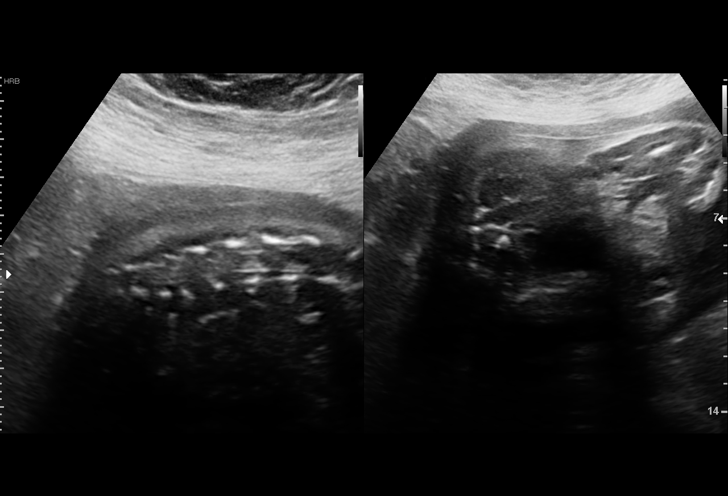

[14 of 28 positions shown; findings below may reference images not displayed]

[REDACTED].

1  YIXIN VITA              [id number]       [id number]     [id number]
Indications

32 weeks gestation of pregnancy
Pre-existing diabetes, type 2, in pregnancy,
third trimester; insulin and meformin
Obesity complicating pregnancy, third
trimester
Encounter for other antenatal screening
follow-up
Previous cesarean delivery, antepartum
OB History

Blood Type:            Height:  5'6"   Weight (lb):  305       BMI:
Gravidity:    2         Term:   1        Prem:   0        SAB:   0
TOP:          0       Ectopic:  0        Living: 1
Fetal Evaluation

Num Of Fetuses:     1
Fetal Heart         161
Rate(bpm):
Cardiac Activity:   Observed
Presentation:       Transverse, head to maternal left
Placenta:           Posterior, above cervical os
P. Cord Insertion:  Previously Visualized

Amniotic Fluid
AFI FV:      Subjectively within normal limits

AFI Sum(cm)     %Tile       Largest Pocket(cm)
14.16           49

RUQ(cm)       RLQ(cm)       LUQ(cm)        LLQ(cm)
3.75
Biometry

BPD:      79.4  mm     G. Age:  31w 6d         17  %    CI:        77.17   %    70 - 86
FL/HC:       21.8  %    19.9 -
HC:      286.2  mm     G. Age:  31w 3d        < 3  %    HC/AC:       1.01       0.96 -
AC:      282.4  mm     G. Age:  32w 2d         34  %    FL/BPD:      78.6  %    71 - 87
FL:       62.4  mm     G. Age:  32w 2d         24  %    FL/AC:       22.1  %    20 - 24
HUM:      53.2  mm     G. Age:  31w 0d         20  %

Est. FW:    5057   gm     4 lb 4 oz     44  %
Gestational Age

LMP:           33w 6d        Date:  12/12/16                 EDD:   09/18/17
U/S Today:     32w 0d                                        EDD:   10/01/17
Best:          32w 6d     Det. By:  Early Ultrasound         EDD:   09/25/17
(02/03/17)
Anatomy

Cranium:               Appears normal         Aortic Arch:            Previously seen
Cavum:                 Appears normal         Ductal Arch:            Previously seen
Ventricles:            Appears normal         Diaphragm:              Appears normal
Choroid Plexus:        Previously seen        Stomach:                Appears normal, left
sided
Cerebellum:            Previously seen        Abdomen:                Appears normal
Posterior Fossa:       Previously seen        Abdominal Wall:         Previously seen
Nuchal Fold:           Not applicable (>20    Cord Vessels:           Previously seen
wks GA)
Face:                  Profile ps; orbits not Kidneys:                Appear normal
well visualized
Lips:                  Previously seen        Bladder:                Appears normal
Thoracic:              Appears normal         Spine:                  Ltd views no
intracranial signs of
NT
Heart:                 Previously seen        Upper Extremities:      Previously seen,
hands nws
RVOT:                  Previously seen        Lower Extremities:      Previously seen,
feet nws
LVOT:                  Previously seen

Other:  Female gender previously seen. Technically difficult due to maternal
habitus and fetal position.
Cervix Uterus Adnexa

Cervix
Not visualized (advanced GA >95wks)
Impression

SIUP at 32+6 weeks
Normal interval anatomy; anatomic survey complete
Normal amniotic fluid volume
Appropriate interval growth with EFW at the 44th %tile
Recommendations

Follow-up ultrasound for growth in 4 weeks
Continue antenatal testing
